# Patient Record
Sex: Male | Born: 1965 | Race: White | Hispanic: No | Smoking: Current every day smoker
Health system: Southern US, Community
[De-identification: ages and names within clinical notes are randomized; demographics above are authoritative.]

## PROBLEM LIST (undated history)

## (undated) HISTORY — PX: HERNIA REPAIR: SHX51

---

## 2002-06-06 ENCOUNTER — Emergency Department (HOSPITAL_COMMUNITY): Admission: EM | Admit: 2002-06-06 | Discharge: 2002-06-06 | Payer: Self-pay | Admitting: *Deleted

## 2002-06-10 ENCOUNTER — Encounter: Payer: Self-pay | Admitting: General Surgery

## 2002-06-10 ENCOUNTER — Encounter: Admission: RE | Admit: 2002-06-10 | Discharge: 2002-06-10 | Payer: Self-pay | Admitting: General Surgery

## 2002-06-11 ENCOUNTER — Ambulatory Visit (HOSPITAL_BASED_OUTPATIENT_CLINIC_OR_DEPARTMENT_OTHER): Admission: RE | Admit: 2002-06-11 | Discharge: 2002-06-11 | Payer: Self-pay | Admitting: General Surgery

## 2002-06-11 ENCOUNTER — Encounter (INDEPENDENT_AMBULATORY_CARE_PROVIDER_SITE_OTHER): Payer: Self-pay | Admitting: Specialist

## 2005-04-10 ENCOUNTER — Inpatient Hospital Stay (HOSPITAL_COMMUNITY): Admission: EM | Admit: 2005-04-10 | Discharge: 2005-04-13 | Payer: Self-pay

## 2005-04-11 ENCOUNTER — Encounter: Payer: Self-pay | Admitting: Cardiovascular Disease

## 2005-04-11 ENCOUNTER — Ambulatory Visit: Payer: Self-pay | Admitting: Cardiovascular Disease

## 2005-04-13 ENCOUNTER — Encounter: Payer: Self-pay | Admitting: Cardiology

## 2009-02-27 ENCOUNTER — Emergency Department (HOSPITAL_COMMUNITY): Admission: EM | Admit: 2009-02-27 | Discharge: 2009-02-27 | Payer: Self-pay | Admitting: Emergency Medicine

## 2010-12-18 LAB — CBC
HCT: 45.8 % (ref 39.0–52.0)
MCHC: 34.4 g/dL (ref 30.0–36.0)
Platelets: 174 10*3/uL (ref 150–400)
RDW: 13.9 % (ref 11.5–15.5)
WBC: 11.9 10*3/uL — ABNORMAL HIGH (ref 4.0–10.5)

## 2010-12-18 LAB — DIFFERENTIAL
Eosinophils Absolute: 0.1 10*3/uL (ref 0.0–0.7)
Lymphocytes Relative: 12 % (ref 12–46)
Lymphs Abs: 1.4 10*3/uL (ref 0.7–4.0)
Monocytes Absolute: 0.8 10*3/uL (ref 0.1–1.0)
Monocytes Relative: 7 % (ref 3–12)

## 2010-12-18 LAB — URINALYSIS, ROUTINE W REFLEX MICROSCOPIC
Ketones, ur: NEGATIVE mg/dL
Nitrite: NEGATIVE
Protein, ur: NEGATIVE mg/dL
Urobilinogen, UA: 0.2 mg/dL (ref 0.0–1.0)

## 2010-12-18 LAB — POCT I-STAT, CHEM 8
Calcium, Ion: 1.06 mmol/L — ABNORMAL LOW (ref 1.12–1.32)
Chloride: 108 mEq/L (ref 96–112)
Creatinine, Ser: 0.8 mg/dL (ref 0.4–1.5)
Glucose, Bld: 97 mg/dL (ref 70–99)
HCT: 48 % (ref 39.0–52.0)
Sodium: 139 mEq/L (ref 135–145)
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
## 2011-01-26 NOTE — Op Note (Signed)
NAME:  Chris Walker, Chris Walker NO.:  [id number]   MEDICAL RECORD NO.:  [id number]                   PATIENT TYPE:  AMB   LOCATION:  DSC                                  FACILITY:  [facility]   PHYSICIAN:  Ollen Gross. Vernell Morgans, M.D.              DATE OF BIRTH:  [birth year]   DATE OF PROCEDURE:  06/11/2002  DATE OF DISCHARGE:  06/11/2002                                 OPERATIVE REPORT   PREOPERATIVE DIAGNOSIS:  Left inguinal hernia.   POSTOPERATIVE DIAGNOSIS:  Left indirect inguinal hernia.   PROCEDURE:  Left indirect inguinal hernia repair with mesh.   SURGEON:  Ollen Gross. Carolynne Edouard, M.D.   ANESTHESIA:  General via LMA.   PROCEDURE:  After informed consent was obtained, the patient was brought to  the operating room and placed in the supine position on the operating table.  After adequate induction of general anesthesia, the patient's left groin and  abdomen were prepped with Betadine and draped in the usual sterile manner.   An approximately 5-cm or so incision was made along the line from the edge  of the pubic tubercle towards the anterior superior iliac spine with a 15-  blade knife.  This incision was carried down through the skin and  subcutaneous tissue using the Bovie electrocautery until the fascia of the  external oblique was encountered.  This fascia was opened along the line of  its fibers using a 15-blade knife.  A pair of Metzenbaum scissors were then  inserted underneath the fascia to make sure there was nothing adhered to the  bottom edge of this fascial layer.  The fascia was then opened along its  fibers to the apex of the external ring.  Blunt dissection was then carried  out in the inguinal canal and the spermatic cord was surrounding between two  fingers at the edge of the pubic tubercle.  A quarter-inch Penrose was then  placed around the spermatic cord structure for retraction purposes.  Blunt  dissection was then carried out of the cord structures  until the hernia sac  was identified.  Care was taken to avoid any injury to the ilioinguinal  nerve at this point.  The hernia sac was able to be separated bluntly as  well as sharply from the rest of the spermatic cord structures.  The hernia  sac was opened and there were no visceral contents within the hernia sac.  The sac was then ligated near its base with a 2-0 silk suture ligature and  the sac was excised and sent to pathology for identification.  The sac stump  was then allowed to retract back beneath the transversalis muscle.   Next a piece of 3 x 6 mesh was cut to fit in the inguinal region.  Tails  were cut laterally on the mesh.  The mesh was sewn in a running fashion with  a 2-0 Prolene  stitch inferiorly to the shelving edge of the inguinal  ligament.  The mesh was then sewn superiorly in a vertical mattress manner  to the musculoaponeurotic layer of the transversalis.  The tails were  wrapped around the spermatic cord and out laterally and anchored laterally  to the shelving edge of the inguinal ligament with an interrupted 2-0  Prolene stitch.  The wound was examined and found to be hemostatic. The  external oblique was then closed over this layer with a running 3-0 Vicryl  stitch.  The subcutaneous fascial layer was then closed with interrupted 3-0  Vicryl stitches.  The skin was closed with a running 4-0 Monocryl  subcuticular  stitch.  Benzoin, Steri-Strips and sterile dressings were applied.  The  patient tolerated the procedure well.  At the end of the case all needle  sponge and instrument counts were correct.  The patient was awakened and  taken to the recovery room in stable condition.                                               Ollen Gross. Vernell Morgans, M.D.    PST/MEDQ  D:  06/22/2002  T:  06/23/2002  Job:  [id number]
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
## 2014-10-19 ENCOUNTER — Encounter (HOSPITAL_COMMUNITY): Payer: Self-pay | Admitting: Emergency Medicine

## 2014-10-19 ENCOUNTER — Emergency Department (HOSPITAL_COMMUNITY)
Admission: EM | Admit: 2014-10-19 | Discharge: 2014-10-19 | Disposition: A | Discharge status: Home / Self Care | Payer: Self-pay | Attending: Emergency Medicine | Admitting: Emergency Medicine

## 2014-10-19 DIAGNOSIS — Z23 Encounter for immunization: Secondary | ICD-10-CM | POA: Insufficient documentation

## 2014-10-19 DIAGNOSIS — W260XXA Contact with knife, initial encounter: Secondary | ICD-10-CM | POA: Insufficient documentation

## 2014-10-19 DIAGNOSIS — Z720 Tobacco use: Secondary | ICD-10-CM | POA: Insufficient documentation

## 2014-10-19 DIAGNOSIS — Z792 Long term (current) use of antibiotics: Secondary | ICD-10-CM | POA: Insufficient documentation

## 2014-10-19 DIAGNOSIS — Y9389 Activity, other specified: Secondary | ICD-10-CM | POA: Insufficient documentation

## 2014-10-19 DIAGNOSIS — Y9289 Other specified places as the place of occurrence of the external cause: Secondary | ICD-10-CM | POA: Insufficient documentation

## 2014-10-19 DIAGNOSIS — S61412A Laceration without foreign body of left hand, initial encounter: Secondary | ICD-10-CM | POA: Insufficient documentation

## 2014-10-19 DIAGNOSIS — Y998 Other external cause status: Secondary | ICD-10-CM | POA: Insufficient documentation

## 2014-10-19 MED ORDER — TETANUS-DIPHTH-ACELL PERTUSSIS 5-2.5-18.5 LF-MCG/0.5 IM SUSP
0.5000 mL | Freq: Once | INTRAMUSCULAR | Status: AC
  Administered 2014-10-19: 0.5 mL via INTRAMUSCULAR
  Filled 2014-10-19: qty 0.5

## 2014-10-19 MED ORDER — LIDOCAINE HCL (PF) 1 % IJ SOLN
5.0000 mL | Freq: Once | INTRAMUSCULAR | Status: AC
  Administered 2014-10-19: 5 mL
  Filled 2014-10-19: qty 5

## 2014-10-19 MED ORDER — CEPHALEXIN 500 MG PO CAPS: 500.0000 mg | ORAL_CAPSULE | Freq: Four times a day (QID) | ORAL | Status: AC
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
## 2014-10-19 NOTE — Discharge Instructions (Signed)
Keep wound dry and do not remove dressing for 24 hours if possible. After that, wash gently morning and night (every 12 hours) with soap and water. Use a topical antibiotic ointment and cover with a bandaid or gauze.    Do NOT use rubbing alcohol or hydrogen peroxide, do not soak the area   Present to your primary care doctor or the urgent care of your choice, or the ED for suture removal in 7-10 days.   Every attempt was made to remove foreign body (contaminants) from the wound.  However, there is always a chance that some may remain in the wound. This can  increase your risk of infection.   If you see signs of infection (warmth, redness, tenderness, pus, sharp increase in pain, fever, red streaking in the skin) immediately return to the emergency department.   After the wound heals fully, apply sunscreen for 6-12 months to minimize scarring.   Do not hesitate to return to the emergency room for any new, worsening or concerning symptoms.  Please obtain primary care using resource guide below. But the minute you were seen in the emergency room and that they will need to obtain records for further outpatient management.   Laceration Care, Adult A laceration is a cut that goes through all layers of the skin. The cut goes into the tissue beneath the skin. HOME CARE For stitches (sutures) or staples:  Keep the cut clean and dry.  If you have a bandage (dressing), change it at least once a day. Change the bandage if it gets wet or dirty, or as told by your doctor.  Wash the cut with soap and water 2 times a day. Rinse the cut with water. Pat it dry with a clean towel.  Put a thin layer of medicated cream on the cut as told by your doctor.  You may shower after the first 24 hours. Do not soak the cut in water until the stitches are removed.  Only take medicines as told by your doctor.  Have your stitches or staples removed as told by your doctor. For skin adhesive strips:  Keep the cut  clean and dry.  Do not get the strips wet. You may take a bath, but be careful to keep the cut dry.  If the cut gets wet, pat it dry with a clean towel.  The strips will fall off on their own. Do not remove the strips that are still stuck to the cut. For wound glue:  You may shower or take baths. Do not soak or scrub the cut. Do not swim. Avoid heavy sweating until the glue falls off on its own. After a shower or bath, pat the cut dry with a clean towel.  Do not put medicine on your cut until the glue falls off.  If you have a bandage, do not put tape over the glue.  Avoid lots of sunlight or tanning lamps until the glue falls off. Put sunscreen on the cut for the first year to reduce your scar.  The glue will fall off on its own. Do not pick at the glue. You may need a tetanus shot if:  You cannot remember when you had your last tetanus shot.  You have never had a tetanus shot. If you need a tetanus shot and you choose not to have one, you may get tetanus. Sickness from tetanus can be serious. GET HELP RIGHT AWAY IF:   Your pain does not get better with medicine.  Your arm, hand, leg, or foot loses feeling (numbness) or changes color.  Your cut is bleeding.  Your joint feels weak, or you cannot use your joint.  You have painful lumps on your body.  Your cut is red, puffy (swollen), or painful.  You have a red line on the skin near the cut.  You have yellowish-white fluid (pus) coming from the cut.  You have a fever.  You have a bad smell coming from the cut or bandage.  Your cut breaks open before or after stitches are removed.  You notice something coming out of the cut, such as wood or glass.  You cannot move a finger or toe. MAKE SURE YOU:   Understand these instructions.  Will watch your condition.  Will get help right away if you are not doing well or get worse. Document Released: 02/13/2008 Document Revised: 11/19/2011 Document Reviewed:  02/20/2011 [facility] Patient Information 2015 [place], [place]. This information is not intended to replace advice given to you by your health care provider. Make sure you discuss any questions you have with your health care provider.   Emergency Department Resource Guide 1) Find a Doctor and Pay Out of Pocket Although you won't have to find out who is covered by your insurance plan, it is a good idea to ask around and get recommendations. You will then need to call the office and see if the doctor you have chosen will accept you as a new patient and what types of options they offer for patients who are self-pay. Some doctors offer discounts or will set up payment plans for their patients who do not have insurance, but you will need to ask so you aren't surprised when you get to your appointment.  2) Contact Your Local Health Department Not all health departments have doctors that can see patients for sick visits, but many do, so it is worth a call to see if yours does. If you don't know where your local health department is, you can check in your phone book. The CDC also has a tool to help you locate your state's health department, and many state websites also have listings of all of their local health departments.  3) Find a Walk-in Clinic If your illness is not likely to be very severe or complicated, you may want to try a walk in clinic. These are popping up all over the country in pharmacies, drugstores, and shopping centers. They're usually staffed by nurse practitioners or physician assistants that have been trained to treat common illnesses and complaints. They're usually fairly quick and inexpensive. However, if you have serious medical issues or chronic medical problems, these are probably not your best option.  No Primary Care Doctor: - Call Health Connect at  [phone] - they can help you locate a primary care doctor that  accepts your insurance, provides certain services, etc. - Physician  Referral Service- [phone]  Chronic Pain Problems: Organization         Address  Phone   Notes  [facility]  [phone] Patients need to be referred by their primary care doctor.   Medication Assistance: Organization         Address  Phone   Notes  [facility] [address] [place], [place] [phone] --Must be a resident of [place] -- Must have NO insurance coverage whatsoever (no Medicaid/ Medicare, etc.) -- The pt. MUST have a primary care doctor  that directs their care regularly and follows them in the community   MedAssist  [phone]   [employer]  [phone]    Agencies that provide inexpensive medical care: Organization         Address  Phone   Notes  [facility]  [phone]   [facility]    [phone]   [facility] [address] [place], [place] [address] [phone]   [facility] [address] [place] [place] [phone]   Planned Parenthood    [phone]   [facility]    [phone]   [facility]  [address], [place] Phone:  [phone], Fax:  [phone] Hours of Operation:  9 am - 6 pm, M-F.  Also accepts Medicaid/Medicare and self-pay.  [facility]  [address], [place] Phone: [phone], Fax: [phone]. Hours of Operation:  8:30 am - 5:30 pm, M-F.  Also accepts Medicaid and self-pay.  [facility] [place] [address] [place] Phone: [phone]   [facility] [address] Natasha Bence [place], [place] [phone], Ext. [phone] Mondays & Thursdays: 7-9 AM.  First 15 patients are seen on a first come, first serve basis.    Medicaid-accepting [place] Providers:  Organization         Address  Phone   Notes  [facility] [address], [place] [phone] Also accepts self-pay patients.  [facility] [address] Laurell Josephs [place], [place]  [phone]   [facility] [address], [place] [phone]   [facility] [address], [place] [phone]   Renaye Rakers [address], [place]   [phone] Only accepts [place] Access [place] patients after they have their name applied to their card.   Self-Pay (no insurance) in [facility]:  Organization         Address  Phone   Notes  Sickle Cell Patients, [facility] Medicine [address] [place], [place] [phone]   [facility] Urgent Care [address] [place], [place] [phone]   [facility] [place]  [address], [place] [phone]   [facility]/Dr. Osei-Bonsu  [address], [place] or [address], [place] [phone] Phone number for both [place] and [place] locations is the same.  [facility] [address], [place] [phone]   [facility] [address] [place] or [address] [phone]   [facility] [address] [place] [phone], phone; [phone], fax Sees patients 1st and 3rd Saturday of every month.  Must not qualify for public or private insurance (i.e. Medicaid, Medicare, [place] Health Choice, Veterans' Benefits)  Household income should be no more than 200% of the poverty level The clinic cannot treat you if you are pregnant or think you are pregnant  Sexually transmitted diseases are not treated at the clinic.    Dental Care: Organization         Address  Phone  Notes  [facility] [address] [place], [place] [phone] Accepts children up  to age 32 who are enrolled  in Medicaid or [place] Health Choice; pregnant women with a Medicaid card; and children who have applied for Medicaid or [place] Health Choice, but were declined, whose parents can pay a reduced fee at time of service.  [facility] of [facility]  [address], [place] [phone] Accepts children up to age 24 who are enrolled in [place] or [place] Health Choice; pregnant women with a Medicaid card; and children who have applied for Medicaid or [place] Health Choice, but were declined, whose parents can pay a reduced fee at time of service.  [facility]  [address] [place], [place] [phone] Patients are seen by appointment only. Walk-ins are not accepted. [facility] will see patients 72 years of age and older. Monday - Tuesday (8am-5pm) Most Wednesdays (8:30-5pm) $30 per visit, cash only  [facility] Adult Dental Access PROGRAM  [address], [place] [phone] Patients are seen by appointment only. Walk-ins are not accepted. [facility] will see patients 43 years of age and older. One Wednesday Evening (Monthly: Volunteer Based).  $30 per visit, cash only  [employer]  [phone] for adults; Children under age 8, call Graduate Pediatric Dentistry at [phone]. Children aged 48-14, please call [phone] to request a pediatric application.  Dental services are provided in all areas of dental care including fillings, crowns and bridges, complete and partial dentures, implants, gum treatment, root canals, and extractions. Preventive care is also provided. Treatment is provided to both adults and children. Patients are selected via a lottery and there is often a waiting list.   [facility] [address], [place]  [phone] [email or web address]   [facility] [address] [place], [place] [phone], Ext. [phone] Second and Fourth Thursday of each month, opens at  6:30 AM; Clinic ends at 9 AM.  Patients are seen on a first-come first-served basis, and a limited number are seen during each clinic.   [facility]  [address] Ether Griffins [place], [place] [phone]   Eligibility Requirements You must have lived in [place], [place], or [place] counties for at least the last three months.   You cannot be eligible for state or federal sponsored [place], including [employer], [place], or [employer].   You generally cannot be eligible for healthcare insurance through your employer.    How to apply: Eligibility screenings are held every Tuesday and Wednesday afternoon from 1:00 pm until 4:00 pm. You do not need an appointment for the interview!  [facility] [address], [place], [place] [phone]   [facility] Health Department  [phone]   [facility] Health Department  [phone]   [facility] Health Department  [phone]    Behavioral Health Resources in the Community: Intensive Outpatient Programs Organization         Address  Phone  Notes  [facility] Services [address] [place], [place] [phone]   [facility] Outpatient [address] [place], [place] [phone]   [facility] [address], [place], [place]  [phone]   [facility] Mental Health [address]  [place], [place] [phone] or [phone]   Substance Abuse Resources Organization         Address  Phone  Notes  Alcohol and Drug Services  [phone]   [facility]  [phone]   [facility] [place]  [phone]   Floydene Flock  [phone]   Residential & Outpatient Substance Abuse Program  [phone]   Psychological Services Organization         Address  Phone  Notes  [facility] Behavioral Health  [phone]   [facility] Services  [phone]   [facility] Mental Health [address] [place]  [phone] or [phone]    Mobile Crisis Teams Organization         Address  Phone  Notes  [facility], Mobile Crisis Care Unit  [phone]   [facility]  [address] [place], [place] [phone]   Doristine Locks [address] [place] [phone]    Self-Help/Support Groups Organization         Address  Phone             Notes  [facility] [place] - variety of support groups  336- I7437963 Call for more information  Narcotics Anonymous (NA), [facility] [address], [employer] [place]  2 meetings at this location   Statistician         Address  Phone  Notes  [facility] [address],    [place]  [phone]   [facility]  [address], [place] [address], [place], [place] [phone]   [facility] [address] [place], [place] [phone] Admissions: 8am-3pm M-F  [facility] [address]    [place], [place] [phone]   [facility] [address] [place], [place], [place] [phone]   [facility] [address],  [place], [place] [phone]   [facility] - Intensive Outpatient [address] Laurell Josephs [address], [place], [place] [phone]   [facility] ([facility]iction Recovery [facility] [address] [place].,  [place], [place] [phone] or [phone]   [facility] ([facility]) [address], [place], [place] [phone] Accepts Medicaid  [facility] [place] [place] [address]  [place] [phone] Substance Abuse/Addiction Treatment   [facility] Organization         Address  Phone  Notes  [facility]  [phone]   Angie Fava, PhD [address] Ervin Knack [place], [place]   [phone] or [phone]   [facility] Behavioral   [address] [place], [place] [phone]   [facility]  [address], [place], [place] [phone] Insurance/Medicaid/sponsorship through [facility] and Families [address]                                    [place], [place] [phone] Therapy/tele-psych/case  [facility] [address] [place], [place] [phone]    Dr. Lolly Mustache  [phone]   [facility]  [employer] [facility] Dept. 1) [address], [place] 2) [address] [place] 3)  [address], [place] [phone]  [phone]   [facility] Child Abuse Hotline 9061232905 or [phone] (After Hours)

## 2014-10-19 NOTE — ED Provider Notes (Signed)
CSN: [id number]     Arrival date & time 10/19/14  4098 History   First MD Initiated Contact with Patient 10/19/14 1001     Chief Complaint  Patient presents with  . Extremity Laceration     (Consider location/radiation/quality/duration/timing/severity/associated sxs/prior Treatment) HPI   Chris Walker is a 49 y.o. male complaining of laceration to left hand in between thumb and second digit recurrent just prior to arrival. Last tetanus shot is unknown. States that he was using his dominant (right hand) with his pocket knife to open a zip tie, the knife slipped and he impacted the left hand. There was no pulsatile or difficult to control bleeding. Eyes reduced range of motion, numbness.   History reviewed. No pertinent past medical history. Past Surgical History  Procedure Laterality Date  . Hernia repair     No family history on file. History  Substance Use Topics  . Smoking status: Current Every Day Smoker  . Smokeless tobacco: Not on file  . Alcohol Use: Yes    Review of Systems  10 systems reviewed and found to be negative, except as noted in the HPI.   Allergies  Review of patient's allergies indicates no known allergies.  Home Medications   Prior to Admission medications   Medication Sig Start Date End Date Taking? Authorizing Provider  cephALEXin (KEFLEX) 500 MG capsule Take 1 capsule (500 mg total) by mouth 4 (four) times daily. 10/19/14   Autym Siess, PA-C   BP 154/105 mmHg  Pulse 82  Temp(Src) 97.5 F (36.4 C) (Oral)  Resp 16  SpO2 98% Physical Exam  Constitutional: He is oriented to person, place, and time. He appears well-developed and well-nourished. No distress.  HENT:  Head: Normocephalic and atraumatic.  Mouth/Throat: Oropharynx is clear and moist.  Eyes: Conjunctivae and EOM are normal. Pupils are equal, round, and reactive to light.  Neck: Normal range of motion.  Cardiovascular: Normal rate, regular rhythm and intact distal pulses.     Pulmonary/Chest: Effort normal and breath sounds normal. No stridor. No respiratory distress. He has no wheezes. He has no rales. He exhibits no tenderness.  Abdominal: Soft. Bowel sounds are normal. He exhibits no distension and no mass. There is no tenderness. There is no rebound and no guarding.  Musculoskeletal: Normal range of motion.       Hands: 2 full-thickness lacerations to area between left thumb and left second digit, one measuring 2 cm, one measuring 1.5 cm, bleeding is controlled, there is no gross contamination, range of motion in flexion and extension of all interphalangeal joints to the left first and second digit. Neurovascular intact.  Neurological: He is alert and oriented to person, place, and time.  Psychiatric: He has a normal mood and affect.  Nursing note and vitals reviewed.   ED Course  LACERATION REPAIR Date/Time: 10/19/2014 11:11 AM Performed by: Wynetta Emery Authorized by: Wynetta Emery Consent: Verbal consent obtained. Consent given by: patient Patient identity confirmed: verbally with patient Body area: upper extremity Location details: left hand Laceration length: 2 cm Foreign bodies: no foreign bodies Anesthesia: local infiltration Local anesthetic: lidocaine 1% without epinephrine Anesthetic total: 3 ml Patient sedated: no Irrigation solution: saline Irrigation method: syringe Amount of cleaning: extensive Debridement: none Degree of undermining: none Wound skin closure material used: 4-0 Ethilon. Number of sutures: 4 Technique: running Approximation: close Approximation difficulty: simple Dressing: antibiotic ointment and 4x4 sterile gauze Patient tolerance: Patient tolerated the procedure well with no immediate complications    LACERATION  REPAIR Date/Time: 10/19/2014 11:11 AM Performed by: Wynetta EmeryPISCIOTTA, Tessy Pawelski Authorized by: Wynetta EmeryPISCIOTTA, Olivine Hiers Consent: Verbal consent obtained. Consent given by: patient Patient identity confirmed:  verbally with patient Body area: upper extremity Location details: left hand Laceration length: 1.5 cm Foreign bodies: no foreign bodies Anesthesia: local infiltration Local anesthetic: lidocaine 1% without epinephrine Anesthetic total: 3 ml Patient sedated: no Irrigation solution: saline Irrigation method: syringe Amount of cleaning: extensive Debridement: none Degree of undermining: none Wound skin closure material used: 4-0 Ethilon. Number of sutures: 3 Technique: running Approximation: close Approximation difficulty: simple Dressing: antibiotic ointment and 4x4 sterile gauze Patient tolerance: Patient tolerated the procedure well with no immediate complications   Labs Review Labs Reviewed - No data to display  Imaging Review No results found.   EKG Interpretation None      MDM   Final diagnoses:  Hand laceration, left, initial encounter    Filed Vitals:   10/19/14 0956  BP: 154/105  Pulse: 82  Temp: 97.5 F (36.4 C)  TempSrc: Oral  Resp: 16  SpO2: 98%    Medications  Tdap (BOOSTRIX) injection 0.5 mL (not administered)  lidocaine (PF) (XYLOCAINE) 1 % injection 5 mL (5 mLs Infiltration Given 10/19/14 1016)    Chris Walker is a pleasant 49 y.o. male presenting with laceration to left (nondominant) hand. No tendon or nerve involvement. Tdap booster given. Pressure irrigation performed. Laceration occurred < 8 hours prior to repair which was well tolerated. Pt has no co morbidities to effect normal wound healing. Discussed suture home care w pt and answered questions. Pt to f-u for wound check and suture removal in 10 days. Pt is hemodynamically stable w no complaints prior to dc.   Evaluation does not show pathology that would require ongoing emergent intervention or inpatient treatment. Pt is hemodynamically stable and mentating appropriately. Discussed findings and plan with patient/guardian, who agrees with care plan. All questions answered. Return  precautions discussed and outpatient follow up given.   New Prescriptions   CEPHALEXIN (KEFLEX) 500 MG CAPSULE    Take 1 capsule (500 mg total) by mouth 4 (four) times daily.         Wynetta Emeryicole Chrissa Meetze, PA-C 10/19/14 1113  Richardean Canalavid H Yao, MD 10/19/14 [phone]

## 2014-10-19 NOTE — ED Notes (Signed)
Pt comfortable with discharge and follow up instructions. Pt declines wheelchair, escorted to waiting area by this RN. Prescriptions x1. 

## 2020-06-08 ENCOUNTER — Other Ambulatory Visit: Payer: Self-pay

## 2020-06-08 DIAGNOSIS — Z20822 Contact with and (suspected) exposure to covid-19: Secondary | ICD-10-CM

## 2020-06-10 LAB — SARS-COV-2, NAA 2 DAY TAT

## 2020-06-10 LAB — NOVEL CORONAVIRUS, NAA: SARS-CoV-2, NAA: NOT DETECTED
